# Patient Record
Sex: Female | Born: 1965
Health system: Southern US, Community
[De-identification: ages and names within clinical notes are randomized; demographics above are authoritative.]

## PROBLEM LIST (undated history)

## (undated) DIAGNOSIS — I341 Nonrheumatic mitral (valve) prolapse: Secondary | ICD-10-CM

## (undated) DIAGNOSIS — F411 Generalized anxiety disorder: Secondary | ICD-10-CM

## (undated) DIAGNOSIS — R002 Palpitations: Secondary | ICD-10-CM

## (undated) HISTORY — DX: Palpitations: R00.2

## (undated) HISTORY — DX: Generalized anxiety disorder: F41.1

## (undated) HISTORY — DX: Nonrheumatic mitral (valve) prolapse: I34.1

---

## 1998-02-15 ENCOUNTER — Emergency Department (HOSPITAL_COMMUNITY): Admission: EM | Admit: 1998-02-15 | Discharge: 1998-02-15 | Payer: Self-pay | Admitting: Emergency Medicine

## 1998-04-04 ENCOUNTER — Other Ambulatory Visit: Admission: RE | Admit: 1998-04-04 | Discharge: 1998-04-04 | Payer: Self-pay | Admitting: Obstetrics and Gynecology

## 1998-05-30 ENCOUNTER — Other Ambulatory Visit: Admission: RE | Admit: 1998-05-30 | Discharge: 1998-05-30 | Payer: Self-pay | Admitting: Obstetrics and Gynecology

## 1998-09-19 ENCOUNTER — Emergency Department (HOSPITAL_COMMUNITY): Admission: EM | Admit: 1998-09-19 | Discharge: 1998-09-19 | Payer: Self-pay | Admitting: Emergency Medicine

## 1998-12-14 ENCOUNTER — Inpatient Hospital Stay (HOSPITAL_COMMUNITY): Admission: AD | Admit: 1998-12-14 | Discharge: 1998-12-16 | Payer: Self-pay | Admitting: Obstetrics and Gynecology

## 1999-01-17 ENCOUNTER — Other Ambulatory Visit: Admission: RE | Admit: 1999-01-17 | Discharge: 1999-01-17 | Payer: Self-pay | Admitting: Obstetrics and Gynecology

## 1999-04-08 ENCOUNTER — Emergency Department (HOSPITAL_COMMUNITY): Admission: EM | Admit: 1999-04-08 | Discharge: 1999-04-08 | Payer: Self-pay | Admitting: Emergency Medicine

## 1999-09-24 ENCOUNTER — Encounter: Payer: Self-pay | Admitting: Obstetrics and Gynecology

## 1999-09-24 ENCOUNTER — Ambulatory Visit (HOSPITAL_COMMUNITY): Admission: RE | Admit: 1999-09-24 | Discharge: 1999-09-24 | Payer: Self-pay | Admitting: Obstetrics and Gynecology

## 2000-04-01 ENCOUNTER — Other Ambulatory Visit: Admission: RE | Admit: 2000-04-01 | Discharge: 2000-04-01 | Payer: Self-pay | Admitting: Obstetrics and Gynecology

## 2001-06-23 ENCOUNTER — Other Ambulatory Visit: Admission: RE | Admit: 2001-06-23 | Discharge: 2001-06-23 | Payer: Self-pay | Admitting: Obstetrics and Gynecology

## 2001-11-25 ENCOUNTER — Ambulatory Visit (HOSPITAL_COMMUNITY): Admission: RE | Admit: 2001-11-25 | Discharge: 2001-11-25 | Payer: Self-pay | Admitting: Obstetrics & Gynecology

## 2001-11-25 ENCOUNTER — Encounter: Payer: Self-pay | Admitting: Obstetrics & Gynecology

## 2001-12-03 ENCOUNTER — Encounter: Payer: Self-pay | Admitting: Obstetrics and Gynecology

## 2001-12-03 ENCOUNTER — Ambulatory Visit (HOSPITAL_COMMUNITY): Admission: RE | Admit: 2001-12-03 | Discharge: 2001-12-03 | Payer: Self-pay | Admitting: Obstetrics and Gynecology

## 2002-09-28 ENCOUNTER — Encounter: Payer: Self-pay | Admitting: Obstetrics and Gynecology

## 2002-09-28 ENCOUNTER — Ambulatory Visit (HOSPITAL_COMMUNITY): Admission: RE | Admit: 2002-09-28 | Discharge: 2002-09-28 | Payer: Self-pay | Admitting: Obstetrics and Gynecology

## 2002-11-17 ENCOUNTER — Other Ambulatory Visit: Admission: RE | Admit: 2002-11-17 | Discharge: 2002-11-17 | Payer: Self-pay | Admitting: Obstetrics and Gynecology

## 2004-05-10 ENCOUNTER — Other Ambulatory Visit: Admission: RE | Admit: 2004-05-10 | Discharge: 2004-05-10 | Payer: Self-pay | Admitting: Obstetrics and Gynecology

## 2005-01-18 ENCOUNTER — Encounter: Admission: RE | Admit: 2005-01-18 | Discharge: 2005-01-18 | Payer: Self-pay | Admitting: Obstetrics and Gynecology

## 2005-07-05 ENCOUNTER — Other Ambulatory Visit: Admission: RE | Admit: 2005-07-05 | Discharge: 2005-07-05 | Payer: Self-pay | Admitting: Obstetrics and Gynecology

## 2007-11-02 ENCOUNTER — Ambulatory Visit (HOSPITAL_COMMUNITY): Admission: RE | Admit: 2007-11-02 | Discharge: 2007-11-02 | Payer: Self-pay | Admitting: Obstetrics and Gynecology

## 2008-07-01 ENCOUNTER — Ambulatory Visit (HOSPITAL_COMMUNITY): Admission: RE | Admit: 2008-07-01 | Discharge: 2008-07-01 | Payer: Self-pay | Admitting: Obstetrics and Gynecology

## 2010-07-27 ENCOUNTER — Encounter: Admission: RE | Admit: 2010-07-27 | Discharge: 2010-07-27 | Payer: Self-pay | Admitting: Obstetrics and Gynecology

## 2010-12-02 ENCOUNTER — Encounter: Payer: Self-pay | Admitting: Obstetrics and Gynecology

## 2014-07-26 ENCOUNTER — Other Ambulatory Visit: Payer: Self-pay | Admitting: Obstetrics and Gynecology

## 2014-07-26 DIAGNOSIS — N63 Unspecified lump in unspecified breast: Secondary | ICD-10-CM

## 2014-07-29 ENCOUNTER — Encounter (INDEPENDENT_AMBULATORY_CARE_PROVIDER_SITE_OTHER): Payer: Self-pay

## 2014-07-29 ENCOUNTER — Ambulatory Visit
Admission: RE | Admit: 2014-07-29 | Discharge: 2014-07-29 | Disposition: A | Discharge status: Home / Self Care | Payer: BC Managed Care – PPO | Source: Ambulatory Visit | Attending: Obstetrics and Gynecology | Admitting: Obstetrics and Gynecology

## 2014-07-29 DIAGNOSIS — N63 Unspecified lump in unspecified breast: Secondary | ICD-10-CM

## 2014-09-15 ENCOUNTER — Telehealth: Payer: Self-pay | Admitting: Interventional Cardiology

## 2014-09-15 NOTE — Telephone Encounter (Signed)
Received incoming call from patient c/o anxiety and palpitations. She has been seen by Dr. Katrinka BlazingSmith last in 2011.  She is requesting to see Dr. Katrinka BlazingSmith for reassurance that the palpitations are not harmful. Reports that when she is not anxious she doesn't feel her heart skip or race. Reviewed medications. Pt asked about phone consultation with Dr. Katrinka BlazingSmith. Encouraged her to make an appointment to see MD, as he is not doing phone consults. She reports feeling better after our lengthy phone conversation and is appreciative for the time spent on the phone with her. New patient appointment with Dr. Katrinka BlazingSmith made Jan 5. She would like to know if Dr. Katrinka BlazingSmith has any recommendations for her in the meantime.

## 2014-09-15 NOTE — Telephone Encounter (Signed)
New message          C/o heart racing / anxiety

## 2014-11-15 ENCOUNTER — Ambulatory Visit: Payer: BC Managed Care – PPO | Admitting: Interventional Cardiology

## 2014-11-22 ENCOUNTER — Ambulatory Visit (INDEPENDENT_AMBULATORY_CARE_PROVIDER_SITE_OTHER): Payer: Managed Care, Other (non HMO) | Admitting: Interventional Cardiology

## 2014-11-22 ENCOUNTER — Encounter: Payer: Self-pay | Admitting: Interventional Cardiology

## 2014-11-22 VITALS — BP 118/72 | HR 87 | Ht 63.0 in | Wt 139.0 lb

## 2014-11-22 DIAGNOSIS — R002 Palpitations: Secondary | ICD-10-CM | POA: Insufficient documentation

## 2014-11-22 DIAGNOSIS — F411 Generalized anxiety disorder: Secondary | ICD-10-CM

## 2014-11-22 NOTE — Patient Instructions (Signed)
Your physician recommends that you continue on your current medications as directed. Please refer to the Current Medication list given to you today. Your physician recommends that you schedule a follow-up appointment in: as needed with Dr. Katrinka BlazingSmith.

## 2014-11-22 NOTE — Progress Notes (Signed)
Patient ID: [name], female   DOB: May 08, 1966, 49 y.o.   MRN: [id number]   Date: 11/22/2014 ID: Michelle Malone, DOB 1966/11/04, MRN [id number] PCP: Juluis Mire, MD  Reason:  Papitations  ASSESSMENT;  1.  Palpitations 2.. Anxiety disorder   PLAN:  1.  Reassurance  2. No specific cardiac evaluation at this time   SUBJECTIVE: Michelle Malone is a 49 y.o. 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 She has a long history of palpitations. She denies any cardiopulmonary complaints other than palpitations. There is no chest discomfort, dyspnea, orthopnea, syncope, or edema. She is not limited in her typical activity.  She has not had PND. There is no exertional related complaint.  No Known Allergies  Current Outpatient Prescriptions on File Prior to Visit  Medication Sig Dispense Refill  . clonazePAM (KLONOPIN) 0.5 MG tablet Take 0.5 mg by mouth as directed. Taking Klonipin 0.5 mg tab 1/4 tablet as needed    . Norgestim-Eth Estrad Triphasic (ORTHO TRI-CYCLEN LO PO) Take by mouth. Ortho Tri-Cyclen Lo 0.18/0.215/0.25 MG-25 MCG Tablet 1 tablet Once a day    . sertraline (ZOLOFT) 50 MG tablet Take 50 mg by mouth daily.     No current facility-administered medications on file prior to visit.    Past Medical History  Diagnosis Date  . Palpitations     Related to sinus tachycardia most likely.  . Mitral valve prolapse     I am not sure that this is a current legitimate clinical diagnosis. There is no auscultatory evidence of such diagnosis.  . Other anxiety states     Panic attacks not otherwise differentiated. She may need to see a psychiatrist.     No past surgical history on file.  History   Social History  . Marital Status: Married    Spouse Name: N/A    Number of Children: N/A  . Years of Education: N/A   Occupational History  . Not on file.   Social History Main Topics  . Smoking status: Never Smoker   . Smokeless tobacco:  Not on file  . Alcohol Use: Not on file  . Drug Use: Not on file  . Sexual Activity: Not on file   Other Topics Concern  . Not on file   Social History Narrative    Family History  Problem Relation Age of Onset  . Hypertension Mother   . Diabetes Mother   . Hypertension Father   . Cancer Sister     Endometrial CA   . Prostate cancer Brother     ROS:  Physically inactive. College-age  son plays baseball and [employer]. Denies transient neurological symptoms.  No history of endocrine or thyroid disease. Recent TSH level what has been performed and was normal no weight loss or weight gain of significance appetite is been stable. Other systems negative for complaints.  OBJECTIVE: BP 118/72 mmHg  Pulse 87  Ht  (1.6 m)  Wt 139 lb (63.05 kg)  BMI 24.63 kg/m2,  General: No acute distress,  Appearing younger than stated age HEENT: normal  Without jaundice or pallor Neck: JVD  flat. Carotids   Absent Chest:  clear Cardiac: Murmur:  absent. Gallop:  absent. Rhythm:  normal. Other:  normal Abdomen: Bruit:  absent. Pulsation:  absent Extremities: Edema:   absent. Pulses:  1+ bilateral upper and lower extremities Neuro:   normal Psych:  Somewhat nervous-appearing but otherwise no  focal abnormalities  ECG:  normal

## 2015-08-03 ENCOUNTER — Telehealth: Payer: Self-pay | Admitting: Nurse Practitioner

## 2015-08-03 NOTE — Telephone Encounter (Signed)
Phone call today - complaining of worsening palpitations over the past few days. Wanting reassurance.  Encouraged her to continue to refrain from caffeine. She has no alcohol intake. She has no associated symptoms. She does not exercise.   Can do event monitor/holter if persists.   She thanked me for talking with her.   Will be available as needed.   Rosalio Macadamia, RN, ANP-C [facility] [address] [place], [place]  [address] [phone]

## 2016-01-12 ENCOUNTER — Emergency Department (HOSPITAL_COMMUNITY): Payer: Managed Care, Other (non HMO)

## 2016-01-12 ENCOUNTER — Emergency Department (HOSPITAL_COMMUNITY)
Admission: EM | Admit: 2016-01-12 | Discharge: 2016-01-12 | Disposition: A | Discharge status: Home / Self Care | Payer: Managed Care, Other (non HMO) | Attending: Emergency Medicine | Admitting: Emergency Medicine

## 2016-01-12 ENCOUNTER — Encounter (HOSPITAL_COMMUNITY): Payer: Self-pay | Admitting: Emergency Medicine

## 2016-01-12 DIAGNOSIS — I349 Nonrheumatic mitral valve disorder, unspecified: Secondary | ICD-10-CM | POA: Diagnosis not present

## 2016-01-12 DIAGNOSIS — I491 Atrial premature depolarization: Secondary | ICD-10-CM | POA: Insufficient documentation

## 2016-01-12 DIAGNOSIS — F419 Anxiety disorder, unspecified: Secondary | ICD-10-CM

## 2016-01-12 DIAGNOSIS — Z79899 Other long term (current) drug therapy: Secondary | ICD-10-CM | POA: Insufficient documentation

## 2016-01-12 DIAGNOSIS — R002 Palpitations: Secondary | ICD-10-CM | POA: Diagnosis present

## 2016-01-12 LAB — BASIC METABOLIC PANEL
Anion gap: 12 (ref 5–15)
BUN: 9 mg/dL (ref 6–20)
CHLORIDE: 104 mmol/L (ref 101–111)
CO2: 23 mmol/L (ref 22–32)
CREATININE: 0.68 mg/dL (ref 0.44–1.00)
Calcium: 10 mg/dL (ref 8.9–10.3)
GFR calc non Af Amer: >60 mL/min (ref >60)
Glucose, Bld: 105 mg/dL — ABNORMAL HIGH (ref 65–99)
Potassium: 3.9 mmol/L (ref 3.5–5.1)
Sodium: 139 mmol/L (ref 135–145)

## 2016-01-12 LAB — CBC
HEMATOCRIT: 40.2 % (ref 36.0–46.0)
HEMOGLOBIN: 14.1 g/dL (ref 12.0–15.0)
MCH: 30.7 pg (ref 26.0–34.0)
MCHC: 35.1 g/dL (ref 30.0–36.0)
MCV: 87.6 fL (ref 78.0–100.0)
PLATELETS: 246 10*3/uL (ref 150–400)
RBC: 4.59 MIL/uL (ref 3.87–5.11)
RDW: 13.4 % (ref 11.5–15.5)
WBC: 7.4 10*3/uL (ref 4.0–10.5)

## 2016-01-12 LAB — I-STAT TROPONIN, ED: Troponin i, poc: 0 ng/mL (ref 0.00–0.08)

## 2016-01-12 MED ORDER — LORAZEPAM 2 MG/ML IJ SOLN
0.5000 mg | Freq: Once | INTRAMUSCULAR | Status: AC
  Administered 2016-01-12: 0.5 mg via INTRAVENOUS
  Filled 2016-01-12: qty 1

## 2016-01-12 MED ORDER — SODIUM CHLORIDE 0.9 % IV BOLUS (SEPSIS)
1000.0000 mL | Freq: Once | INTRAVENOUS | Status: AC
  Administered 2016-01-12: 1000 mL via INTRAVENOUS

## 2016-01-12 MED ORDER — METOPROLOL SUCCINATE ER 25 MG PO TB24: 25.0000 mg | ORAL_TABLET | Freq: Every day | ORAL | Status: DC

## 2016-01-12 MED ORDER — METOPROLOL TARTRATE 1 MG/ML IV SOLN
5.0000 mg | Freq: Once | INTRAVENOUS | Status: AC
  Administered 2016-01-12: 5 mg via INTRAVENOUS
  Filled 2016-01-12: qty 5

## 2016-01-12 NOTE — Discharge Instructions (Signed)
Premature Atrial Contraction Premature atrial contractions (PACs) happen when your heart beats before it has had time to fill with blood. Your heart then has to pause until it can fill with blood for the next beat. This causes the next beat to be more forceful. PACs are also called skipped heartbeats because it may feel like your heart stops for a second.  Your heart has four chambers. There are two upper chambers (atria) and two lower chambers (ventricles). All the chambers need to work together to pump blood properly. Electrical signals spread across your heart and make all the chambers beat together.The signal to beat starts in your atria. If the atria fire a bit early, you may have a PAC.  CAUSES  The cause of a PAC is often unknown. PACs are sometimes caused by heart disease or injury. RISK FACTORS PACs are more common in children and older people. Other risk factors that may trigger PACs include:  Caffeine.  Stress.  Fatigue.  Alcohol.  Smoking.  Stimulant drugs. These may be prescription or illegal drugs.  Heart disease. SIGNS AND SYMPTOMS PACs are very common, especially in children and people 50 years and older. PACs do not cause dizziness, shortness of breath, or chest pain. The only symptom of a PAC is the sensation of a skipped or fluttering heartbeat.  DIAGNOSIS  Your health care provider can diagnose PAC based on the description of your symptom. Your health care provider may also:  Perform a physical exam to listen to your heart. Your heart may sound normal during this exam.  Perform tests to rule out other conditions. These tests may include an electrical tracing of your heart called electrocardiogram (ECG). You may need to wear a portable ECG machine (Holter monitor) that records your heart for 24 hours or more. TREATMENT  In most cases, PACs do not need to be treated. If you have frequent PACs that are caused by heart disease, you may be treated for the underlying  condition. HOME CARE INSTRUCTIONS  Do not use any tobacco products, including cigarettes, chewing tobacco, or electronic cigarettes. If you need help quitting, ask your health care provider.  Limit alcohol intake to no more than 1 drink per day for nonpregnant women and 2 drinks per day for men. One drink equals 12 ounces of beer, 5 ounces of wine, or 1 ounces of hard liquor.  Limit the amount of caffeine you take in.  Do not use illegal drugs.  Get at least 8 hours of sleep every night.  Find healthy ways to manage stress.  Get regular exercise. Ask your health care provider to suggest some activities that are safe for you. SEEK MEDICAL CARE IF:  You feel your heart skipping beats often (more than once a day).  Your heart skips beats and you feel dizzy, lightheaded, or very tired. SEEK IMMEDIATE MEDICAL CARE IF:   You have chest pain.  You have trouble breathing.   This information is not intended to replace advice given to you by your health care provider. Make sure you discuss any questions you have with your health care provider.   Document Released: 07/01/2014 Document Revised: 03/14/2015 Document Reviewed: 07/01/2014 Elsevier Interactive Patient Education 2016 Elsevier Inc.  

## 2016-01-12 NOTE — ED Notes (Signed)
Pt sts palpitations worse than normal with anxiety today; pt denies CP or SOB

## 2016-01-12 NOTE — ED Provider Notes (Signed)
CSN: [id number]     Arrival date & time 01/12/16  1158 History   First MD Initiated Contact with Patient 01/12/16 1245     Chief Complaint  Patient presents with  . Palpitations     HPI Patient presents to the emergency department with complaints of palpitations and anxiety.  She has long-standing history of both.  She states that earlier today her palpitations and anxiety worsened and did not improve with Klonopin.  She denies chest pain or pressure this time.  No shortness of breath.  Denies nausea vomiting or diarrhea.  Denies caffeine intake.  Denies significant chocolate intake.  She is not on beta blockers.  She does take Klonopin however she only takes a quarter milligram when necessary.   Past Medical History  Diagnosis Date  . Palpitations     Related to sinus tachycardia most likely.  . Mitral valve prolapse     I am not sure that this is a current legitimate clinical diagnosis. There is no auscultatory evidence of such diagnosis.  . Other anxiety states     Panic attacks not otherwise differentiated. She may need to see a psychiatrist.    History reviewed. No pertinent past surgical history. Family History  Problem Relation Age of Onset  . Hypertension Mother   . Diabetes Mother   . Hypertension Father   . Cancer Sister     Endometrial CA   . Prostate cancer Brother    Social History  Substance Use Topics  . Smoking status: Never Smoker   . Smokeless tobacco: None  . Alcohol Use: None   OB History    No data available     Review of Systems  All other systems reviewed and are negative.     Allergies  Review of patient's allergies indicates no known allergies.  Home Medications   Prior to Admission medications   Medication Sig Start Date End Date Taking? Authorizing Provider  clonazePAM (KLONOPIN) 0.5 MG tablet Take 0.125 mg by mouth 2 (two) times daily as needed for anxiety. Taking Klonipin 0.5 mg tab 1/4 tablet as needed   Yes Historical Provider, MD   ibuprofen (ADVIL,MOTRIN) 200 MG tablet Take 400 mg by mouth every 6 (six) hours as needed for mild pain.   Yes Historical Provider, MD  Norgestim-Eth Charlott HollerEstrad Triphasic (ORTHO TRI-CYCLEN LO PO) Take by mouth. Ortho Tri-Cyclen Lo 0.18/0.215/0.25 MG-25 MCG Tablet 1 tablet Once a day   Yes Historical Provider, MD  sertraline (ZOLOFT) 50 MG tablet Take 50 mg by mouth daily.   Yes Historical Provider, MD  metoprolol succinate (TOPROL-XL) 25 MG 24 hr tablet Take 1 tablet (25 mg total) by mouth daily. 01/12/16   Azalia BilisKevin Khalis Hittle, MD   BP 132/95 mmHg  Pulse 108  Temp(Src) 98.3 F (36.8 C) (Oral)  Resp 20  SpO2 100% Physical Exam  Constitutional: She is oriented to person, place, and time. She appears well-developed and well-nourished. No distress.  HENT:  Head: Normocephalic and atraumatic.  Eyes: EOM are normal.  Neck: Normal range of motion.  Cardiovascular: Regular rhythm and normal heart sounds.   Tachycardia with PACs  Pulmonary/Chest: Effort normal and breath sounds normal.  Abdominal: Soft. She exhibits no distension. There is no tenderness.  Musculoskeletal: Normal range of motion.  Neurological: She is alert and oriented to person, place, and time.  Skin: Skin is warm and dry.  Psychiatric: She has a normal mood and affect. Judgment normal.  Nursing note and vitals reviewed.   ED  Course  Procedures (including critical care time) Labs Review Labs Reviewed  BASIC METABOLIC PANEL - Abnormal; Notable for the following:    Glucose, Bld 105 (*)    All other components within normal limits  CBC  I-STAT TROPOININ, ED    Imaging Review Dg Chest 2 View  01/12/2016  CLINICAL DATA:  Palpitations. Tachycardia. Anxiety. Mitral valve prolapse. EXAM: CHEST  2 VIEW COMPARISON:  11/02/2007 FINDINGS: The heart size and mediastinal contours are within normal limits. Both lungs are clear. The visualized skeletal structures are unremarkable. IMPRESSION: Negative.  No active cardiopulmonary disease.  Electronically Signed   By: Myles Rosenthal M.D.   On: 01/12/2016 12:46   I have personally reviewed and evaluated these images and lab results as part of my medical decision-making.   EKG Interpretation   Date/Time:  Friday January 12 2016 12:01:29 EST Ventricular Rate:  140 PR Interval:  116 QRS Duration: 80 QT Interval:  300 QTC Calculation: 458 R Axis:   91 Text Interpretation:  Sinus tachycardia with Premature atrial complexes  Rightward axis Marked ST abnormality, possible inferior subendocardial  injury Abnormal ECG pac's new since prior tracing Confirmed by Natalye Kott  MD,  Caryn Bee ([id number]) on 01/12/2016 1:19:31 PM      MDM   Final diagnoses:  PAC (premature atrial contraction)  Anxiety    Patient feels much better after IV fluids, IV Lopressor, Ativan.  Discharge home on Toprol-XL.  She was encouraged to increase her Klonopin from a quarter milligram to half milligram as needed.  Primary care follow-up.    Azalia Bilis, MD 01/12/16 [phone]

## 2016-01-15 ENCOUNTER — Telehealth: Payer: Self-pay | Admitting: Interventional Cardiology

## 2016-01-15 NOTE — Telephone Encounter (Signed)
New Message  Pt called states that she is having more tachycardia and PAC's and this is causing more problems with Anxiety. Recently in ER and pt is requesting a same day appt//Michelle Malone

## 2016-01-15 NOTE — Telephone Encounter (Signed)
Returned pt call. Pt sts that she has been doing better since being started on Metoprolol 25mg  qd after her [facility]. Pt sts that her heartrate is controlled and her palpipitations have reduced. Pt sts that she struggles with anxiety and panic attacks, She also sts that she has thoughts that something is wrong and she is going to dye. Pt has increased her Klonopin to a whole tablet as instructed by Dr.Campos in the ED. Adv pt that she should f/u with her pcp for recommendation on further adjustment of her anxiety meds, recommended that seek a  therapist to help her cope with her anxiety and panic attacks. Pt was tearful and crying on the phone, reassurance given. Pt was only to f/u prn with cardiology. Adv pt to call for a f/u appt if Metoprolol was not keeping her heartrate/ and palpitations under control. Adv her that her anxiety could be driving her palpitations. Pt thanked me for the return call. She will f/u with her pcp

## 2016-02-09 NOTE — Telephone Encounter (Signed)
Patient called requesting an rx for metoprolol to be ordered by Dr Katrinka BlazingSmith. Patient is prn follow up and per previous phone call from 01/15/16, patient will follow up with her pcp, but she informed me that she does not have a pcp. She has enough medication to last her until Monday. Please advise. Thanks, MI

## 2016-02-12 ENCOUNTER — Other Ambulatory Visit: Payer: Self-pay | Admitting: *Deleted

## 2016-02-12 MED ORDER — METOPROLOL SUCCINATE ER 25 MG PO TB24: 25.0000 mg | ORAL_TABLET | Freq: Every day | ORAL | Status: DC

## 2016-02-14 NOTE — Telephone Encounter (Signed)
Refill for her.

## 2016-05-26 ENCOUNTER — Other Ambulatory Visit: Payer: Self-pay | Admitting: Interventional Cardiology

## 2016-05-27 NOTE — Telephone Encounter (Signed)
metoprolol succinate (TOPROL-XL) 25 MG 24 hr tablet  Medication   Date: 02/12/2016  Department: [facility] [address] [facility]  Ordering/Authorizing: Henry W Smith, MD      Order Providers    Prescribing Provider Encounter Provider   Henry W Smith, MD Mindy L Isley, CMA    Medication Detail      Disp Refills Start End     metoprolol succinate (TOPROL-XL) 25 MG 24 hr tablet 30 tablet 2 02/12/2016     Sig - Route: Take 1 tablet (25 mg total) by mouth daily. Further refills need to come from pcp. - Oral    E-Prescribing Status: Receipt confirmed by pharmacy (02/12/2016 8:36 AM EDT)     Pharmacy    [id number] -  (SE), [place] - [address]          

## 2016-05-28 ENCOUNTER — Other Ambulatory Visit: Payer: Self-pay | Admitting: Interventional Cardiology

## 2016-05-29 ENCOUNTER — Other Ambulatory Visit: Payer: Self-pay | Admitting: Interventional Cardiology

## 2016-05-29 NOTE — Telephone Encounter (Signed)
metoprolol succinate (TOPROL-XL) 25 MG 24 hr tablet  Medication   Date: 02/12/2016  [facility] [address] [facility]  Ordering/Authorizing: Lyn RecordsHenry W Smith, MD      Order Providers    Prescribing Provider Encounter Provider   Lyn RecordsHenry W Smith, MD Valrie HartMindy L Isley, CMA    Medication Detail      Disp Refills Start End     metoprolol succinate (TOPROL-XL) 25 MG 24 hr tablet 30 tablet 2 02/12/2016     Sig - Route: Take 1 tablet (25 mg total) by mouth daily. Further refills need to come from pcp. - Oral    E-Prescribing Status: Receipt confirmed by pharmacy (02/12/2016 8:36 AM EDT)     Pharmacy    [id number] - [place] ([place]), [place] - [address]

## 2016-10-27 ENCOUNTER — Other Ambulatory Visit: Payer: Self-pay | Admitting: Interventional Cardiology

## 2016-10-28 NOTE — Telephone Encounter (Signed)
Please advise on refill request as patient has not been seen since 11/22/14. Thanks, MI

## 2016-10-28 NOTE — Telephone Encounter (Signed)
Will need to come from PCP as pt is PRN f/u.  Thanks!

## 2018-10-26 ENCOUNTER — Other Ambulatory Visit: Payer: Self-pay | Admitting: Obstetrics and Gynecology

## 2018-10-26 DIAGNOSIS — R928 Other abnormal and inconclusive findings on diagnostic imaging of breast: Secondary | ICD-10-CM

## 2018-10-27 ENCOUNTER — Ambulatory Visit: Payer: Managed Care, Other (non HMO)

## 2018-10-27 ENCOUNTER — Ambulatory Visit
Admission: RE | Admit: 2018-10-27 | Discharge: 2018-10-27 | Disposition: A | Discharge status: Home / Self Care | Payer: Managed Care, Other (non HMO) | Source: Ambulatory Visit | Attending: Obstetrics and Gynecology | Admitting: Obstetrics and Gynecology

## 2018-10-27 DIAGNOSIS — R928 Other abnormal and inconclusive findings on diagnostic imaging of breast: Secondary | ICD-10-CM

## 2018-10-28 ENCOUNTER — Other Ambulatory Visit: Payer: Managed Care, Other (non HMO)

## 2018-11-16 DIAGNOSIS — N390 Urinary tract infection, site not specified: Secondary | ICD-10-CM | POA: Diagnosis not present

## 2019-06-28 DIAGNOSIS — N390 Urinary tract infection, site not specified: Secondary | ICD-10-CM | POA: Diagnosis not present

## 2019-06-28 DIAGNOSIS — M545 Low back pain: Secondary | ICD-10-CM | POA: Diagnosis not present

## 2019-06-28 DIAGNOSIS — R319 Hematuria, unspecified: Secondary | ICD-10-CM | POA: Diagnosis not present

## 2019-07-05 ENCOUNTER — Other Ambulatory Visit: Payer: Self-pay

## 2019-07-05 DIAGNOSIS — Z20822 Contact with and (suspected) exposure to covid-19: Secondary | ICD-10-CM

## 2019-07-06 LAB — NOVEL CORONAVIRUS, NAA: SARS-CoV-2, NAA: NOT DETECTED

## 2019-09-28 DIAGNOSIS — Z1159 Encounter for screening for other viral diseases: Secondary | ICD-10-CM | POA: Diagnosis not present

## 2019-09-29 ENCOUNTER — Other Ambulatory Visit: Payer: Self-pay

## 2019-09-29 DIAGNOSIS — Z20822 Contact with and (suspected) exposure to covid-19: Secondary | ICD-10-CM

## 2019-10-01 LAB — NOVEL CORONAVIRUS, NAA: SARS-CoV-2, NAA: NOT DETECTED

## 2019-12-06 DIAGNOSIS — Z1159 Encounter for screening for other viral diseases: Secondary | ICD-10-CM | POA: Diagnosis not present

## 2019-12-09 DIAGNOSIS — Z1159 Encounter for screening for other viral diseases: Secondary | ICD-10-CM | POA: Diagnosis not present

## 2020-01-11 DIAGNOSIS — Z1231 Encounter for screening mammogram for malignant neoplasm of breast: Secondary | ICD-10-CM | POA: Diagnosis not present

## 2020-06-03 IMAGING — MG DIGITAL DIAGNOSTIC UNILATERAL LEFT MAMMOGRAM WITH TOMO AND CAD
4 series · 4 of 12 positions shown · non-contrast
Comparison: Previous exam(s).

CLINICAL DATA: Screening recall for possible left breast asymmetry.

EXAM:
DIGITAL DIAGNOSTIC UNILATERAL LEFT MAMMOGRAM WITH CAD AND TOMO

[L MLO synth-2D]
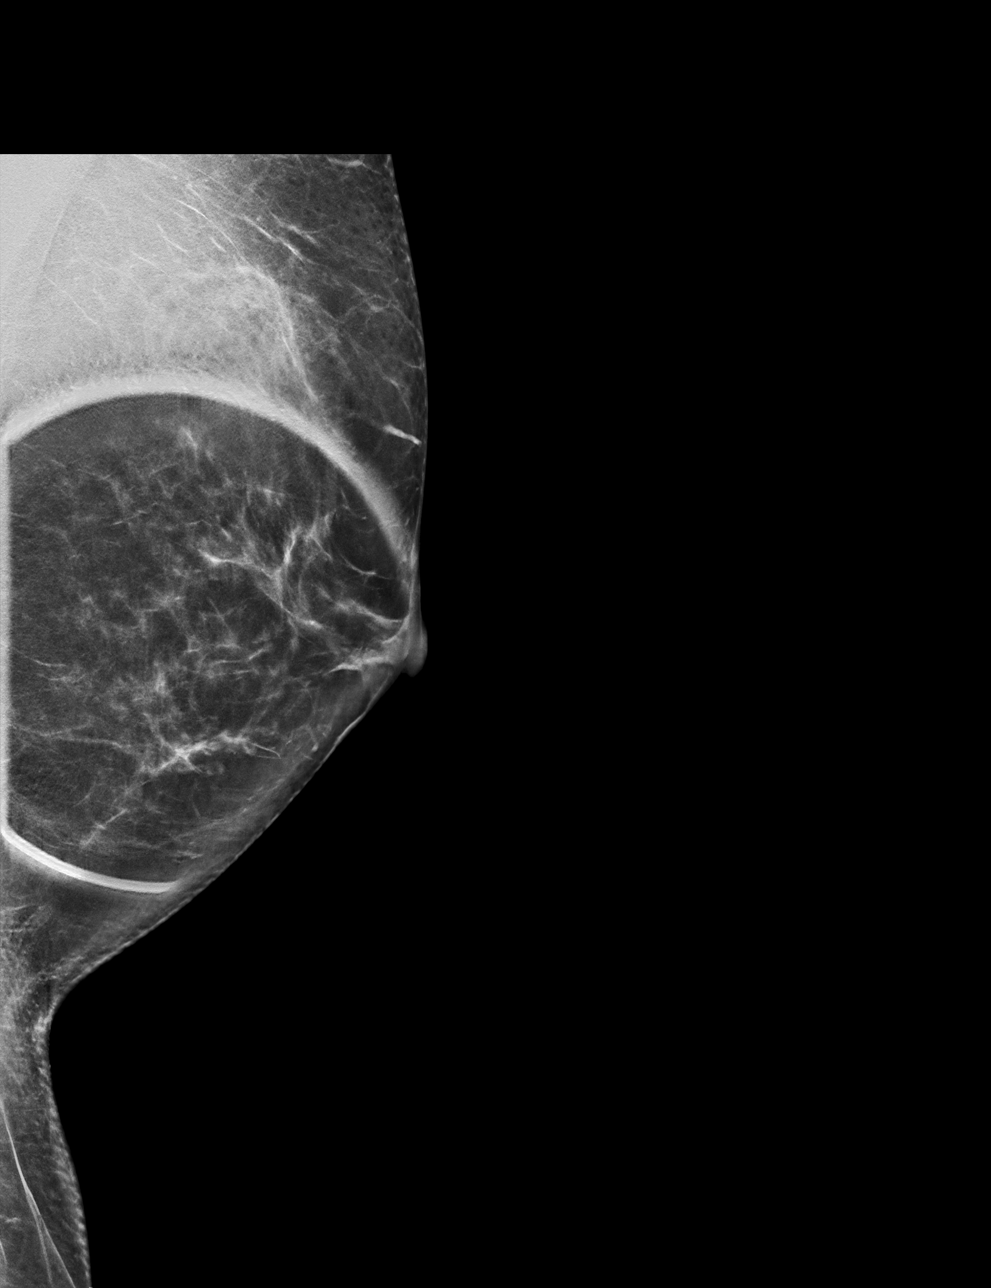

[L CC synth-2D]
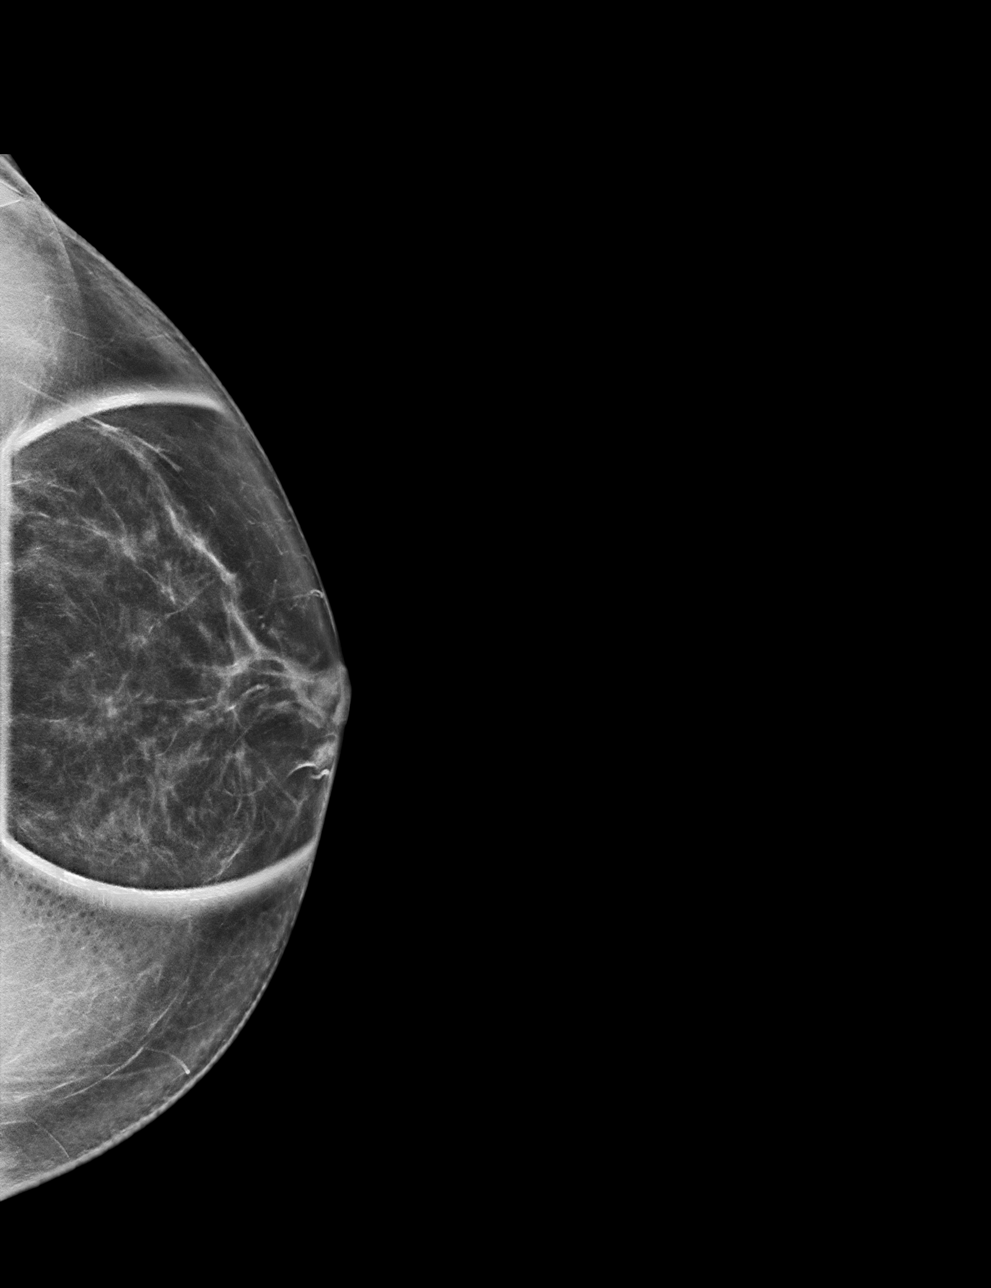

[L CC tomo · tomo slice 37/74.0]
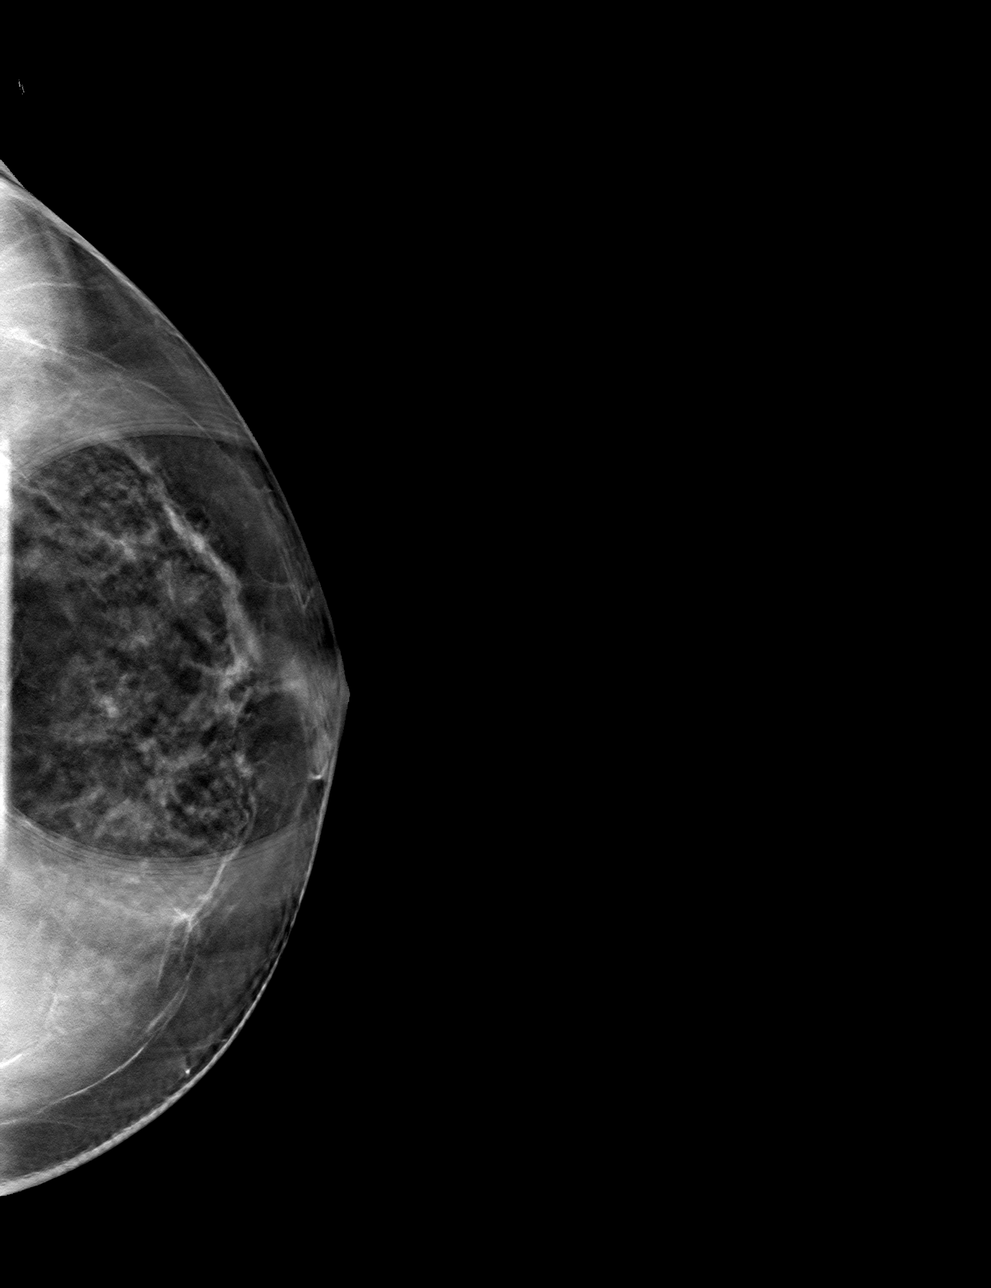

[L MLO tomo · tomo slice 39/76.0]
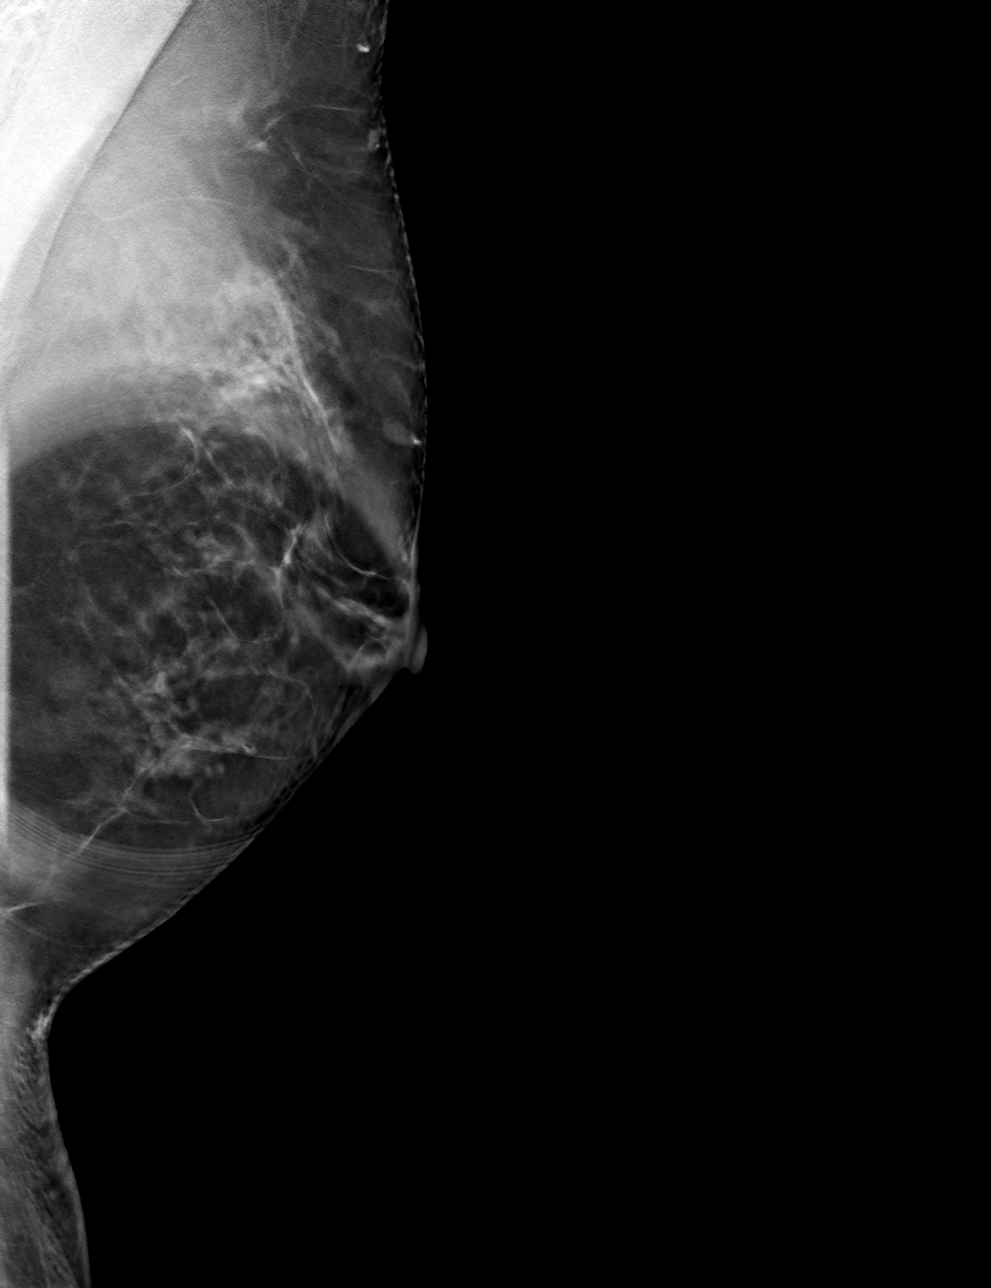

[4 of 12 positions shown; findings below may reference images not displayed]

ACR Breast Density Category b: There are scattered areas of
fibroglandular density.
FINDINGS: Additional tomograms were performed of the left breast. The
initially questioned possible left breast asymmetry resolves on the
additional imaging with findings compatible with an area of
overlapping fibroglandular tissue. There is no mammographic evidence
of malignancy in the left breast.

Mammographic images were processed with CAD.
IMPRESSION: No mammographic evidence of malignancy in the left breast.

RECOMMENDATION:
Screening mammogram in one year.(Code:DY-[id number])

I have discussed the findings and recommendations with the patient.
Results were also provided in writing at the conclusion of the
visit. If applicable, a reminder letter will be sent to the patient
regarding the next appointment.

BI-RADS CATEGORY  1: Negative.

## 2020-07-10 DIAGNOSIS — J069 Acute upper respiratory infection, unspecified: Secondary | ICD-10-CM | POA: Diagnosis not present

## 2020-07-10 DIAGNOSIS — Z20828 Contact with and (suspected) exposure to other viral communicable diseases: Secondary | ICD-10-CM | POA: Diagnosis not present

## 2020-08-15 DIAGNOSIS — N390 Urinary tract infection, site not specified: Secondary | ICD-10-CM | POA: Diagnosis not present

## 2020-08-15 DIAGNOSIS — N951 Menopausal and female climacteric states: Secondary | ICD-10-CM | POA: Diagnosis not present

## 2020-09-20 DIAGNOSIS — N390 Urinary tract infection, site not specified: Secondary | ICD-10-CM | POA: Diagnosis not present

## 2020-10-11 NOTE — Progress Notes (Signed)
Cardiology Office Note:    Date:  10/12/2020   ID:  Michelle Malone, DOB Jan 09, 1966, MRN [id number]  PCP:  Michelle Chimera, MD  Cardiologist:  No primary care provider on file.   Referring MD: Michelle Chimera, MD   Chief Complaint  Patient presents with  . Irregular Heart Beat    History of Present Illness:    Michelle Malone is a 54 y.o. female with a hx of palpitations and anxiety.  I have not seen Michelle Malone in approximately 5 years.  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 Arelia Malone.  She complains of intermittent episodes of premature beats.  She also has increased heart rate with certain physical activities.  She feels her heart rate will stay high for longer than it should.  No associated chest discomfort, orthopnea, PND, edema, syncope, or sustained abnormal tachycardia.  She has an apple watch and her heart rates can ranged between 45 and 135 bpm.  She occasionally has heart rates up to 160s when she does heavy physical activity such as taking the garbage out and or rolling it to the street.  Prior work-up has not revealed any significant cardiac issues.  Her exam is always been normal.  Her cardiogram in 2011 revealed a structurally normal heart without any significant valvular abnormality.  Past Medical History:  Diagnosis Date  . Mitral valve prolapse    I am not sure that this is a current legitimate clinical diagnosis. There is no auscultatory evidence of such diagnosis.  . Other anxiety states    Panic attacks not otherwise differentiated. She may need to see a psychiatrist.   . Palpitations    Related to sinus tachycardia most likely.    History reviewed. No pertinent surgical history.  Current Medications: Current Meds  Medication Sig  . clonazePAM (KLONOPIN) 0.5 MG tablet Take 0.125 mg by mouth 2 (two) times daily as needed for anxiety. Taking Klonipin 0.5 mg tab 1/4 tablet as needed  . ibuprofen (ADVIL,MOTRIN) 200 MG tablet Take 400 mg by mouth  every 6 (six) hours as needed for mild pain.  . metoprolol succinate (TOPROL-XL) 25 MG 24 hr tablet Take 12.5 mg by mouth as needed.  . sertraline (ZOLOFT) 50 MG tablet Take 50 mg by mouth daily.     Allergies:   Patient has no known allergies.   Social History   Socioeconomic History  . Marital status: Married    Spouse name: Not on file  . Number of children: Not on file  . Years of education: Not on file  . Highest education level: Not on file  Occupational History  . Not on file  Tobacco Use  . Smoking status: Never Smoker  . Smokeless tobacco: Never Used  Substance and Sexual Activity  . Alcohol use: Never  . Drug use: Never  . Sexual activity: Not on file  Other Topics Concern  . Not on file  Social History Narrative  . Not on file   Social Determinants of Health   Financial Resource Strain:   . Difficulty of Paying Living Expenses: Not on file  Food Insecurity:   . Worried About Programme researcher, broadcasting/film/video in the Last Year: Not on file  . Ran Out of Food in the Last Year: Not on file  Transportation Needs:   . Lack of Transportation (Medical): Not on file  . Lack of Transportation (Non-Medical): Not on file  Physical Activity:   . Days of Exercise per Week: Not on  file  . Minutes of Exercise per Session: Not on file  Stress:   . Feeling of Stress : Not on file  Social Connections:   . Frequency of Communication with Friends and Family: Not on file  . Frequency of Social Gatherings with Friends and Family: Not on file  . Attends Religious Services: Not on file  . Active Member of Clubs or Organizations: Not on file  . Attends Banker Meetings: Not on file  . Marital Status: Not on file     Family History: The patient's family history includes Cancer in her sister; Diabetes in her mother; Hypertension in her father and mother; Prostate cancer in her brother.  ROS:   Please see the history of present illness.    Klonopin prior to going to work most  days.  She will use an occasional Toprol-XL 1/2 to 1 tablet of 25 mg strength if she is having excessive palpitations.  She does wonder if she could be having atrial fibrillation or some other significant rhythm disturbance.  This has never been a diagnosis.  All other systems reviewed and are negative.  EKGs/Labs/Other Studies Reviewed:    The following studies were reviewed today: Echocardiogram in 2011 revealed structurally normal heart.  EKG:  EKG reveals a normal tracing with nonspecific ST abnormality.  Borderline short PR.  Overall, normal in appearance with heart rate of 87 bpm.  Recent Labs: No results found for requested labs within last 8760 hours.  Recent Lipid Panel No results found for: CHOL, TRIG, HDL, CHOLHDL, VLDL, LDLCALC, LDLDIRECT  Physical Exam:    VS:  BP 136/80   Pulse 87   Ht 5\' 3"  (1.6 m)   Wt 137 lb 3.2 oz (62.2 kg)   SpO2 95%   BMI 24.30 kg/m     Wt Readings from Last 3 Encounters:  10/12/20 137 lb 3.2 oz (62.2 kg)  11/22/14 139 lb (63 kg)     GEN: Healthy-appearing. No acute distress HEENT: Normal NECK: No JVD. LYMPHATICS: No lymphadenopathy CARDIAC:  RRR without murmur, gallop, or edema. VASCULAR:  Normal Pulses. No bruits. RESPIRATORY:  Clear to auscultation without rales, wheezing or rhonchi  ABDOMEN: Soft, non-tender, non-distended, No pulsatile mass, MUSCULOSKELETAL: No deformity  SKIN: Warm and dry NEUROLOGIC:  Alert and oriented x 3 PSYCHIATRIC:  Normal affect   ASSESSMENT:    1. Palpitations   2. Generalized anxiety disorder   3. Educated about COVID-19 virus infection    PLAN:    In order of problems listed above:  1. Palpitations ACEs and or PVCs.  Previously evaluated.  No significant arrhythmia identified. 2. Continues to be an issue and is helped with clonazepam. 3. She had COVID-19 in September.  Loss of taste and smell that persists.  Overall I think she is doing well but does need to have blood work to look at  cholesterol, blood sugar, and hemoglobin.  The most recent lipid panel available from 2019 is favorable with an untreated LDL of 87.  I do not feel any other cardiovascular work-up is needed at this time.  Dr. 2020 usually does blood work on her from his office.   Medication Adjustments/Labs and Tests Ordered: Current medicines are reviewed at length with the patient today.  Concerns regarding medicines are outlined above.  Orders Placed This Encounter  Procedures  . EKG 12-Lead   No orders of the defined types were placed in this encounter.   Patient Instructions  Medication Instructions:  Your physician  recommends that you continue on your current medications as directed. Please refer to the Current Medication list given to you today.  *If you need a refill on your cardiac medications before your next appointment, please call your pharmacy*   Lab Work: None If you have labs (blood work) drawn today and your tests are completely normal, you will receive your results only by: Marland Kitchen MyChart Message (if you have MyChart) OR . A paper copy in the mail If you have any lab test that is abnormal or we need to change your treatment, we will call you to review the results.   Testing/Procedures: None   Follow-Up: At [facility], you and your health needs are our priority.  As part of our continuing mission to provide you with exceptional heart care, we have created designated Provider Care Teams.  These Care Teams include your primary Cardiologist (physician) and Advanced Practice Providers (APPs -  Physician Assistants and Nurse Practitioners) who all work together to provide you with the care you need, when you need it.  We recommend signing up for the patient portal called "MyChart".  Sign up information is provided on this After Visit Summary.  MyChart is used to connect with patients for Virtual Visits (Telemedicine).  Patients are able to view lab/test results, encounter notes,  upcoming appointments, etc.  Non-urgent messages can be sent to your provider as well.   To learn more about what you can do with MyChart, go to [email or web address].    Your next appointment:   As needed  The format for your next appointment:   In Person  Provider:   You may see Dr. Verdis Prime or one of the following Advanced Practice Providers on your designated Care Team:    Norma Fredrickson, NP  Nada Boozer, NP  Georgie Chard, NP    Other Instructions      Signed, Lesleigh Noe, MD  10/12/2020 3:00 PM     [facility]

## 2020-10-12 ENCOUNTER — Ambulatory Visit (INDEPENDENT_AMBULATORY_CARE_PROVIDER_SITE_OTHER): Payer: BC Managed Care – PPO | Admitting: Interventional Cardiology

## 2020-10-12 ENCOUNTER — Other Ambulatory Visit: Payer: Self-pay

## 2020-10-12 ENCOUNTER — Encounter: Payer: Self-pay | Admitting: Interventional Cardiology

## 2020-10-12 VITALS — BP 136/80 | HR 87 | Ht 63.0 in | Wt 137.2 lb

## 2020-10-12 DIAGNOSIS — F411 Generalized anxiety disorder: Secondary | ICD-10-CM | POA: Diagnosis not present

## 2020-10-12 DIAGNOSIS — Z7189 Other specified counseling: Secondary | ICD-10-CM | POA: Diagnosis not present

## 2020-10-12 DIAGNOSIS — R002 Palpitations: Secondary | ICD-10-CM

## 2020-10-12 NOTE — Patient Instructions (Signed)

## 2021-06-18 ENCOUNTER — Telehealth: Payer: Self-pay | Admitting: Interventional Cardiology

## 2021-06-18 NOTE — Telephone Encounter (Signed)
Patient c/o Palpitations:  High priority if patient c/o lightheadedness, shortness of breath, or chest pain  How long have you had palpitations/irregular HR/ Afib? Are you having the symptoms now?  Patient states she has has palpitations and anxiety on and off for the past 3-4 weeks. No symptoms currently.  Are you currently experiencing lightheadedness, SOB or CP?  No  Do you have a history of afib (atrial fibrillation) or irregular heart rhythm?  No, but patient has hx of anxiety Have you checked your BP or HR? (document readings if available):  08/08: HR- 100 BP-138/80, 118/78 Are you experiencing any other symptoms?  Anxiety---patient states occasionally she becomes extremely anxious, causing her HR to increase. Initially she called in to schedule a routine follow up, but when I called back she stated her anxiety has been causing issues the past 3-4 weeks. Requesting advisement from Dr. Katrinka Blazing for peace of mind.

## 2021-06-18 NOTE — Telephone Encounter (Signed)
Spoke with pt and she states back in the fall she had covid and ended up stopping her anxiety meds.  Just went back on her Zoloft about 3 weeks ago.  Has noticed increased palpitations over the last 3-4 weeks but admits that they are associated with episodes of anxiety.  [place] HR jumped up to 140 but she didn't think she felt anxious.  Went to the car to cool off and took a half of Metoprolol and issue resolved.  We spoke about how anxiety can worsen palps.  Symptoms resolve quickly once she calms down.  Advised to continue to work with PCP to help control anxiety and this should help with palps as well. Pt very appreciative for call.

## 2021-06-19 ENCOUNTER — Ambulatory Visit: Payer: BC Managed Care – PPO | Admitting: Interventional Cardiology

## 2024-08-24 ENCOUNTER — Other Ambulatory Visit: Payer: Self-pay | Admitting: Obstetrics and Gynecology

## 2024-08-24 DIAGNOSIS — Z8249 Family history of ischemic heart disease and other diseases of the circulatory system: Secondary | ICD-10-CM

## 2024-08-27 ENCOUNTER — Ambulatory Visit
Admission: RE | Admit: 2024-08-27 | Discharge: 2024-08-27 | Disposition: A | Source: Ambulatory Visit | Attending: Obstetrics and Gynecology | Admitting: Obstetrics and Gynecology

## 2024-08-27 DIAGNOSIS — Z8249 Family history of ischemic heart disease and other diseases of the circulatory system: Secondary | ICD-10-CM
